# Patient Record
Sex: Male | Born: 1985 | Race: White | Hispanic: No | Marital: Single | Smoking: Current every day smoker
Health system: Southern US, Community
[De-identification: ages and names within clinical notes are randomized; demographics above are authoritative.]

---

## 2009-02-18 ENCOUNTER — Emergency Department (HOSPITAL_COMMUNITY): Admission: EM | Admit: 2009-02-18 | Discharge: 2009-02-18 | Payer: Self-pay | Admitting: Emergency Medicine

## 2011-09-21 ENCOUNTER — Emergency Department (HOSPITAL_COMMUNITY): Payer: Self-pay

## 2011-09-21 ENCOUNTER — Encounter (HOSPITAL_COMMUNITY): Payer: Self-pay | Admitting: *Deleted

## 2011-09-21 ENCOUNTER — Emergency Department (HOSPITAL_COMMUNITY)
Admission: EM | Admit: 2011-09-21 | Discharge: 2011-09-21 | Disposition: A | Discharge status: Home / Self Care | Payer: Self-pay | Attending: Emergency Medicine | Admitting: Emergency Medicine

## 2011-09-21 DIAGNOSIS — S8390XA Sprain of unspecified site of unspecified knee, initial encounter: Secondary | ICD-10-CM

## 2011-09-21 DIAGNOSIS — M25569 Pain in unspecified knee: Secondary | ICD-10-CM | POA: Insufficient documentation

## 2011-09-21 DIAGNOSIS — S61409A Unspecified open wound of unspecified hand, initial encounter: Secondary | ICD-10-CM | POA: Insufficient documentation

## 2011-09-21 DIAGNOSIS — IMO0002 Reserved for concepts with insufficient information to code with codable children: Secondary | ICD-10-CM | POA: Insufficient documentation

## 2011-09-21 DIAGNOSIS — M25469 Effusion, unspecified knee: Secondary | ICD-10-CM | POA: Insufficient documentation

## 2011-09-21 MED ORDER — OXYCODONE-ACETAMINOPHEN 5-325 MG PO TABS: 2.0000 | ORAL_TABLET | ORAL | Status: AC | PRN

## 2011-09-21 MED ORDER — ONDANSETRON 4 MG PO TBDP
4.0000 mg | ORAL_TABLET | Freq: Once | ORAL | Status: AC
  Administered 2011-09-21: 4 mg via ORAL
  Filled 2011-09-21: qty 1

## 2011-09-21 MED ORDER — MORPHINE SULFATE 4 MG/ML IJ SOLN
6.0000 mg | Freq: Once | INTRAMUSCULAR | Status: AC
  Administered 2011-09-21: 6 mg via INTRAMUSCULAR
  Filled 2011-09-21: qty 2
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
## 2011-09-21 NOTE — ED Notes (Signed)
Wound care performed.  telfa and kerlix applied

## 2012-03-31 ENCOUNTER — Emergency Department (HOSPITAL_COMMUNITY)
Admission: EM | Admit: 2012-03-31 | Discharge: 2012-03-31 | Disposition: A | Discharge status: Home / Self Care | Payer: Self-pay | Attending: Emergency Medicine | Admitting: Emergency Medicine

## 2012-03-31 ENCOUNTER — Encounter (HOSPITAL_COMMUNITY): Payer: Self-pay | Admitting: *Deleted

## 2012-03-31 DIAGNOSIS — R221 Localized swelling, mass and lump, neck: Secondary | ICD-10-CM | POA: Insufficient documentation

## 2012-03-31 DIAGNOSIS — R220 Localized swelling, mass and lump, head: Secondary | ICD-10-CM | POA: Insufficient documentation

## 2012-03-31 DIAGNOSIS — K047 Periapical abscess without sinus: Secondary | ICD-10-CM | POA: Insufficient documentation

## 2012-03-31 DIAGNOSIS — F17200 Nicotine dependence, unspecified, uncomplicated: Secondary | ICD-10-CM | POA: Insufficient documentation

## 2012-03-31 MED ORDER — OXYCODONE-ACETAMINOPHEN 5-325 MG PO TABS: 2.0000 | ORAL_TABLET | ORAL | Status: DC | PRN

## 2012-03-31 MED ORDER — OXYCODONE-ACETAMINOPHEN 5-325 MG PO TABS
2.0000 | ORAL_TABLET | Freq: Once | ORAL | Status: AC
  Administered 2012-03-31: 2 via ORAL
  Filled 2012-03-31: qty 2

## 2012-03-31 MED ORDER — CLINDAMYCIN HCL 150 MG PO CAPS: 300.0000 mg | ORAL_CAPSULE | Freq: Three times a day (TID) | ORAL | Status: DC

## 2012-03-31 NOTE — ED Notes (Signed)
Has right side toothache that started yesterday and now havign swelling. Airway intact.

## 2012-03-31 NOTE — ED Provider Notes (Signed)
History     CSN: [id number]  Arrival date & time 03/31/12  1423   First MD Initiated Contact with Patient 03/31/12 1726      Chief Complaint  Patient presents with  . Dental Pain    (Consider location/radiation/quality/duration/timing/severity/associated sxs/prior treatment) HPI Comments: The patient is a 27 year old otherwise healthy male who presents with dental pain that started gradually one day ago. The dental pain is severe, constant and progressively worsening. The pain is aching and located in right upper jaw. The pain does not radiate. Eating makes the pain worse. Nothing makes the pain better. The patient has not tried anything for pain. No associated symptoms. Patient denies headache, neck pain/stiffness, fever, NVD, edema, sore throat, throat swelling, wheezing, SOB, chest pain, abdominal pain.      History reviewed. No pertinent past medical history.  History reviewed. No pertinent past surgical history.  History reviewed. No pertinent family history.  History  Substance Use Topics  . Smoking status: Current Every Day Smoker -- 0.5 packs/day  . Smokeless tobacco: Not on file  . Alcohol Use: Yes     Comment: rarely      Review of Systems  HENT: Positive for facial swelling and dental problem.   All other systems reviewed and are negative.    Allergies  Penicillins  Home Medications  No current outpatient prescriptions on file.  BP 138/78  Pulse 79  Temp 98.7 F (37.1 C) (Oral)  Resp 16  SpO2 96%  Physical Exam  Nursing note and vitals reviewed. Constitutional: He is oriented to person, place, and time. He appears well-developed and well-nourished. No distress.  HENT:  Head: Normocephalic and atraumatic.       Poor dentition. Right upper jaw teeth tender to percussion. Right upper jaw swelling that is tender to palpation.   Eyes: Conjunctivae normal are normal.  Neck: Normal range of motion. Neck supple.  Cardiovascular: Normal rate and  regular rhythm.  Exam reveals no gallop and no friction rub.   No murmur heard. Pulmonary/Chest: Effort normal and breath sounds normal. He has no wheezes. He has no rales. He exhibits no tenderness.  Abdominal: Soft. He exhibits no distension.  Musculoskeletal: Normal range of motion.  Neurological: He is alert and oriented to person, place, and time.       Speech is goal-oriented. Moves limbs without ataxia.   Skin: Skin is warm and dry.  Psychiatric: He has a normal mood and affect. His behavior is normal.    ED Course  Procedures (including critical care time)  Labs Reviewed - No data to display No results found.   No diagnosis found.    MDM  5:29 PM Patient will have Pen VK, percocet, and recommended follow up a dentist. Patient is afebrile with stable vitals. Patient instructed to call dentist within 48 hour to be seen. No further evaluation needed at this time.        Emilia Beck, [place] 03/31/12 1926

## 2012-04-01 NOTE — ED Provider Notes (Signed)
Medical screening examination/treatment/procedure(s) were performed by non-physician practitioner and as supervising physician I was immediately available for consultation/collaboration.   Rosha Cocker M Rella Egelston, DO 04/01/12 1607 

## 2015-04-12 ENCOUNTER — Emergency Department (HOSPITAL_COMMUNITY)
Admission: EM | Admit: 2015-04-12 | Discharge: 2015-04-12 | Disposition: A | Discharge status: Home / Self Care | Payer: Self-pay | Attending: Emergency Medicine | Admitting: Emergency Medicine

## 2015-04-12 ENCOUNTER — Encounter (HOSPITAL_COMMUNITY): Payer: Self-pay | Admitting: Nurse Practitioner

## 2015-04-12 DIAGNOSIS — Z880 Allergy status to penicillin: Secondary | ICD-10-CM | POA: Insufficient documentation

## 2015-04-12 DIAGNOSIS — F17200 Nicotine dependence, unspecified, uncomplicated: Secondary | ICD-10-CM | POA: Insufficient documentation

## 2015-04-12 DIAGNOSIS — B86 Scabies: Secondary | ICD-10-CM

## 2015-04-12 DIAGNOSIS — Z792 Long term (current) use of antibiotics: Secondary | ICD-10-CM | POA: Insufficient documentation

## 2015-04-12 MED ORDER — PERMETHRIN 5 % EX CREA: 1.0000 "application " | TOPICAL_CREAM | Freq: Once | CUTANEOUS | Status: DC

## 2015-04-12 NOTE — Discharge Instructions (Signed)
Please read and follow all provided instructions.  Your diagnoses today include:  1. Scabies    Tests performed today include:  Vital signs. See below for your results today.   Medications prescribed:   Permethrin cream - Apply to entire body before bed and wash off in morning, repeat in one week if not resolved.  Home care instructions:  Follow any educational materials contained in this packet.  You can use benadryl as directed on packaging for itching. You may also use loradine (Claritin) as directed as this medication will not make you very sleepy.   Do not use hydrocortisone or any other type of steroid cream -- it will not kill the scabies parasite and will make the rash worse.   You need to disinfect surfaces, vacuum floors, and wash all clothes and bedding in hot water.   Follow-up instructions: Please follow-up with your primary care provider as needed for further evaluation of your symptoms.  Return instructions:   Please return to the Emergency Department if you experience worsening symptoms.   Please return if you have any other emergent concerns.  Additional Information:  Your vital signs today were: BP 142/83 mmHg   Pulse 89   Temp(Src) 98.6 F (37 C) (Oral)   Resp 16   SpO2 97% If your blood pressure (BP) was elevated above 135/85 this visit, please have this repeated by your doctor within one month. ---------------

## 2015-04-12 NOTE — ED Notes (Signed)
Pt verbalized understanding of d/c instructions, prescriptions, and follow-up care. No further questions/concerns, VSS, ambulatory w/ steady gait (refused wheelchair) 

## 2015-04-12 NOTE — ED Provider Notes (Signed)
CSN: [id number]     Arrival date & time 04/12/15  1234 History   First MD Initiated Contact with Patient 04/12/15 1310     Chief Complaint  Patient presents with  . Rash  . possible scabies    (Consider location/radiation/quality/duration/timing/severity/associated sxs/prior Treatment) HPI 30 y.o. male presents to the Emergency Department today complaining of scabies. Notes that he was in jail previously and diagnosed with Scabies. Treated there, but did not change blanket. Symptoms returned 1 month later. Currently itching on hands, arms, torso, legs. No N/V/D. No CP/SOB/ABD pain. No other symptoms noted.   History reviewed. No pertinent past medical history. History reviewed. No pertinent past surgical history. No family history on file. Social History  Substance Use Topics  . Smoking status: Current Every Day Smoker -- 0.50 packs/day  . Smokeless tobacco: None  . Alcohol Use: Yes     Comment: rarely    Review of Systems ROS reviewed and all are negative for acute change except as noted in the HPI.  Allergies  Amoxicillin and Penicillins  Home Medications   Prior to Admission medications   Medication Sig Start Date End Date Taking? Authorizing Provider  clindamycin (CLEOCIN) 150 MG capsule Take 2 capsules (300 mg total) by mouth 3 (three) times daily. May dispense as  capsules 03/31/12   Emilia Beck, PA-C  oxyCODONE-acetaminophen (PERCOCET/ROXICET) 5-325 MG per tablet Take 2 tablets by mouth every 4 (four) hours as needed for pain. 03/31/12   Kaitlyn Szekalski, PA-C   BP 142/83 mmHg  Pulse 89  Temp(Src) 98.6 F (37 C) (Oral)  Resp 16  SpO2 97% Physical Exam  Constitutional: He is oriented to person, place, and time. He appears well-developed and well-nourished.  HENT:  Head: Normocephalic and atraumatic.  Eyes: EOM are normal.  Cardiovascular: Normal rate and regular rhythm.   Pulmonary/Chest: Effort normal.  Abdominal: Soft.  Musculoskeletal: Normal range  of motion.  Diffuse pruritic maculopapular lesions with borrows on hands, arms, torso, legs.   Neurological: He is alert and oriented to person, place, and time.  Skin: Skin is warm and dry.  Psychiatric: He has a normal mood and affect. His behavior is normal. Thought content normal.  Nursing note and vitals reviewed.   ED Course  Procedures (including critical care time) Labs Review Labs Reviewed - No data to display  Imaging Review No results found. I have personally reviewed and evaluated these images and lab results as part of my medical decision-making.   EKG Interpretation None      MDM  I have reviewed the relevant previous healthcare records. I obtained HPI from historian.  ED Course:  Assessment: 1y M with pmh scabies presents with reoccurrence of symptoms. Diffuse pruritic maculopapular lesions with borrows noted on arms/legs/hands, especially web spaces. Will treat with Permethrin and have him follow up with PCP for further management. Patient is in no acute distress. Vital Signs are stable. Patient is able to ambulate. Patient able to tolerate PO.    Disposition/Plan:  DC Home Additional Verbal discharge instructions given and discussed with patient.  Pt Instructed to f/u with PCP in the next 1 week for evaluation and treatment of symptoms. Return precautions given Pt acknowledges and agrees with plan  Supervising Physician Loren Racer, MD   Final diagnoses:  Scabies        Audry Pili, PA-C 04/12/15 1332  Loren Racer, MD 04/12/15 1540
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
## 2015-05-24 DIAGNOSIS — Z139 Encounter for screening, unspecified: Secondary | ICD-10-CM

## 2015-05-29 NOTE — Congregational Nurse Program (Signed)
Congregational Nurse Program Note  Date of Encounter: 05/24/2015  Past Medical History: No past medical history on file.  Encounter Details:     CNP Questionnaire - 05/24/15 1438    Patient Demographics   Is this a new or existing patient? New   Patient is considered a/an Not Applicable   Race Caucasian/White   Patient Assistance   Location of Patient Assistance Not Applicable   Patient's financial/insurance status Low Income;Self-Pay   Uninsured Patient Yes   Interventions Counseled to make appt. with provider   Patient referred to apply for the following financial assistance [employer]   Food insecurities addressed Provided food supplies   Transportation assistance No   Assistance securing medications No   Product/process development scientistducational health offerings Navigating the healthcare system   Encounter Details   Primary purpose of visit Education/Health Concerns   Was an Emergency Department visit averted? Not Applicable   Does patient have a medical provider? No   Patient referred to Clinic   Was a mental health screening completed? (GAINS tool) No   Does patient have dental issues? No   Does patient have vision issues? No   Since previous encounter, have you referred patient for abnormal blood pressure that resulted in a new diagnosis or medication change? No   Since previous encounter, have you referred patient for abnormal blood glucose that resulted in a new diagnosis or medication change? No   For Abstraction Use Only   Does patient have insurance? No      B/P check

## 2015-11-08 ENCOUNTER — Emergency Department (HOSPITAL_COMMUNITY)
Admission: EM | Admit: 2015-11-08 | Discharge: 2015-11-09 | Disposition: A | Discharge status: Home / Self Care | Payer: Self-pay | Attending: Emergency Medicine | Admitting: Emergency Medicine

## 2015-11-08 ENCOUNTER — Encounter (HOSPITAL_COMMUNITY): Payer: Self-pay

## 2015-11-08 DIAGNOSIS — F17200 Nicotine dependence, unspecified, uncomplicated: Secondary | ICD-10-CM | POA: Insufficient documentation

## 2015-11-08 DIAGNOSIS — F1910 Other psychoactive substance abuse, uncomplicated: Secondary | ICD-10-CM | POA: Insufficient documentation

## 2015-11-08 LAB — I-STAT CHEM 8, ED
BUN: 11 mg/dL (ref 6–20)
CALCIUM ION: 1.06 mmol/L — AB (ref 1.15–1.40)
Chloride: 102 mmol/L (ref 101–111)
Creatinine, Ser: 1.1 mg/dL (ref 0.61–1.24)
Glucose, Bld: 168 mg/dL — ABNORMAL HIGH (ref 65–99)
HCT: 50 % (ref 39.0–52.0)
Hemoglobin: 17 g/dL (ref 13.0–17.0)
Potassium: 3.5 mmol/L (ref 3.5–5.1)
SODIUM: 142 mmol/L (ref 135–145)
TCO2: 21 mmol/L (ref 0–100)

## 2015-11-08 NOTE — ED Notes (Signed)
Per EMS- Pt OD'ed on Heroin. Was breathing 6 times per minute with pulse. Lost consciousness- lowered to ground. Diaphoretic. Given total 2mg  of narcan. Admits to using crack, heroin, alcohol, marijuana.

## 2015-11-08 NOTE — ED Provider Notes (Signed)
[facility]-EMERGENCY DEPT Provider Note   CSN: [id number] Arrival date & time: 11/08/15  2219     History   Chief Complaint Chief Complaint  Patient presents with  . Drug Overdose    HPI Ronald Mcbride is a 30 y.o. male.   Drug Overdose    Patient presents to the emergency room after an accidental drug overdose. The patient admits to using marijuana, cocaine and snorting heroin. He doesn't usually use heroin but this is not the first time. He is not sure what happened after that. The next thing he knows is being in the ambulance. According to the EMS report patient was found breathing proximate 6 times per minute. He had a pulse. He was given 2 mg of Narcan. His symptoms have improved. Patient does complain of a headache and some chronic back pain. Denies any fevers or chills. Denies any other complaints. History reviewed. No pertinent past medical history.  There are no active problems to display for this patient.   History reviewed. No pertinent surgical history.     Home Medications    Prior to Admission medications   Not on File    Family History No family history on file.  Social History Social History  Substance Use Topics  . Smoking status: Current Every Day Smoker    Packs/day: 0.50  . Smokeless tobacco: Not on file  . Alcohol use Yes     Comment: rarely     Allergies   Amoxicillin and Penicillins   Review of Systems Review of Systems  All other systems reviewed and are negative.    Physical Exam Updated Vital Signs BP 122/77   Pulse 80   Temp 97.5 F (36.4 C)   Resp 11   SpO2 96%   Physical Exam  Constitutional: He appears well-developed and well-nourished. He appears listless. No distress.  HENT:  Head: Normocephalic and atraumatic.  Right Ear: External ear normal.  Left Ear: External ear normal.  Eyes: Conjunctivae are normal. Right eye exhibits no discharge. Left eye exhibits no discharge. No scleral icterus.  Neck: Neck supple. No  tracheal deviation present.  Cardiovascular: Normal rate, regular rhythm and intact distal pulses.   Pulmonary/Chest: Effort normal and breath sounds normal. No stridor. No respiratory distress. He has no wheezes. He has no rales.  Abdominal: Soft. Bowel sounds are normal. He exhibits no distension. There is no tenderness. There is no rebound and no guarding.  Musculoskeletal: He exhibits no edema or tenderness.  Neurological: He has normal strength. He appears listless. No cranial nerve deficit (no facial droop, extraocular movements intact, no slurred speech) or sensory deficit. He exhibits normal muscle tone. He displays no seizure activity. Coordination normal.  Skin: Skin is warm and dry. No rash noted.  Psychiatric: He has a normal mood and affect.  Nursing note and vitals reviewed.    ED Treatments / Results  Labs (all labs ordered are listed, but only abnormal results are displayed) Labs Reviewed  I-STAT CHEM 8, ED - Abnormal; Notable for the following:       Result Value   Glucose, Bld 168 (*)    Calcium, Ion 1.06 (*)    All other components within normal limits    Procedures Procedures (including critical care time)  Medications Ordered in ED Medications  acetaminophen (TYLENOL) tablet 650 mg (not administered)     Initial Impression / Assessment and Plan / ED Course  I have reviewed the triage vital signs and the nursing notes.  Pertinent  labs & imaging results that were available during my care of the patient were reviewed by me and considered in my medical decision making (see chart for details).  Clinical Course  Comment By Time  Patient was monitored in the emergency room. He remained stable. Patient requested a Tylenol for his lower back before he went home. I will provide him with an outpatient resource guide for substance abuse treatment. Linwood DibblesJon Dewane Timson, MD 09/01 [phone]     Final Clinical Impressions(s) / ED Diagnoses   Final diagnoses:  Substance abuse     New Prescriptions Current Discharge Medication List       Linwood DibblesJon Alban Marucci, MD 11/09/15 [phone]

## 2015-11-09 MED ORDER — ACETAMINOPHEN 325 MG PO TABS
650.0000 mg | ORAL_TABLET | Freq: Once | ORAL | Status: AC
  Administered 2015-11-09: 650 mg via ORAL
  Filled 2015-11-09: qty 2

## 2017-07-29 ENCOUNTER — Other Ambulatory Visit: Payer: Self-pay

## 2017-07-29 ENCOUNTER — Encounter (HOSPITAL_COMMUNITY): Payer: Self-pay | Admitting: *Deleted

## 2017-07-29 ENCOUNTER — Emergency Department (HOSPITAL_COMMUNITY)
Admission: EM | Admit: 2017-07-29 | Discharge: 2017-07-29 | Disposition: A | Discharge status: Home / Self Care | Payer: Self-pay | Attending: Emergency Medicine | Admitting: Emergency Medicine

## 2017-07-29 DIAGNOSIS — Y939 Activity, unspecified: Secondary | ICD-10-CM | POA: Insufficient documentation

## 2017-07-29 DIAGNOSIS — F17210 Nicotine dependence, cigarettes, uncomplicated: Secondary | ICD-10-CM | POA: Insufficient documentation

## 2017-07-29 DIAGNOSIS — Y990 Civilian activity done for income or pay: Secondary | ICD-10-CM | POA: Insufficient documentation

## 2017-07-29 DIAGNOSIS — S61511A Laceration without foreign body of right wrist, initial encounter: Secondary | ICD-10-CM | POA: Insufficient documentation

## 2017-07-29 DIAGNOSIS — W268XXA Contact with other sharp object(s), not elsewhere classified, initial encounter: Secondary | ICD-10-CM | POA: Insufficient documentation

## 2017-07-29 DIAGNOSIS — Y929 Unspecified place or not applicable: Secondary | ICD-10-CM | POA: Insufficient documentation

## 2017-07-29 MED ORDER — BACITRACIN-NEOMYCIN-POLYMYXIN 400-5-5000 EX OINT
TOPICAL_OINTMENT | Freq: Once | CUTANEOUS | Status: AC
  Administered 2017-07-29: 10:00:00 via TOPICAL
  Filled 2017-07-29: qty 1

## 2017-07-29 MED ORDER — LIDOCAINE-EPINEPHRINE (PF) 2 %-1:200000 IJ SOLN
20.0000 mL | Freq: Once | INTRAMUSCULAR | Status: AC
  Administered 2017-07-29: 20 mL
  Filled 2017-07-29: qty 20
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
## 2017-07-29 NOTE — ED Provider Notes (Signed)
Patient under care of Dr. Jeraldine Loots. See his note for full H&P. Briefly pt here with laceration to right distal forearm.  Bleeding controlled PTA. He is ambidextrous.  Denies numbness. Tetanus within 1 year.  Physical Exam  BP 135/83 (BP Location: Left Arm)   Pulse 85   Temp 98.7 F (37.1 C) (Oral)   Resp 16   SpO2 94%   Physical Exam  Constitutional: No distress.  HENT:  Head: Atraumatic.  Eyes: Conjunctivae are normal.  Cardiovascular: Normal rate.  2+ radial and ulnar pulses bilaterally. Brisk cap refill to fingers. Warm fingers.   Pulmonary/Chest: Effort normal.  Musculoskeletal:  Tendon sheath and vein visualized through laceration. Full AROM of RUE.   Neurological: He is alert.  Sensation to light touch intact in right hand, pt reports tingling to middle finger. 5/5 strength in right hand.   Skin:  Approx 8 cm laceration to radial aspect of distal right arm.   Psychiatric: He has a normal mood and affect.    ED Course/Procedures     .Marland KitchenLaceration Repair Date/Time: 07/29/2017 10:00 AM Performed by: Liberty Handy, PA-C Authorized by: Liberty Handy, PA-C   Consent:    Consent obtained:  Verbal   Consent given by:  Patient   Risks discussed:  Infection and pain   Alternatives discussed:  No treatment and referral Anesthesia (see MAR for exact dosages):    Anesthesia method:  Local infiltration   Local anesthetic:  Lidocaine 2% WITH epi Laceration details:    Location:  Shoulder/arm   Shoulder/arm location:  R lower arm   Length (cm):  8 Repair type:    Repair type:  Intermediate Pre-procedure details:    Preparation:  Patient was prepped and draped in usual sterile fashion Exploration:    Hemostasis achieved with:  Epinephrine and direct pressure   Wound exploration: wound explored through full range of motion and entire depth of wound probed and visualized     Wound extent: vascular damage (vein, hemostatic)     Wound extent: no foreign bodies/material  noted, no nerve damage noted and no tendon damage noted   Treatment:    Area cleansed with:  Betadine   Amount of cleaning:  Extensive   Irrigation solution:  Tap water (and cleansing solution)   Irrigation volume:  200   Irrigation method:  Pressure wash and syringe   Visualized foreign bodies/material removed: no   Skin repair:    Repair method:  Sutures   Suture size:  5-0   Suture material:  Prolene   Suture technique:  Simple interrupted   Number of sutures:  8 Approximation:    Approximation:  Close Post-procedure details:    Dressing:  Antibiotic ointment, non-adherent dressing and splint for protection   Patient tolerance of procedure:  Tolerated well, no immediate complications    MDM   Extremity NVI pre and post procedure. Splint for protection. Rt in 8-10 days for suture removal. Wound care instructions given.        Liberty Handy, PA-C 07/29/17 1002    Gerhard Munch, MD 07/29/17 [phone]

## 2017-07-29 NOTE — ED Provider Notes (Signed)
[facility] EMERGENCY DEPARTMENT Provider Note   CSN: [id number] Arrival date & time: 07/29/17  8295     History   Chief Complaint Chief Complaint  Patient presents with  . Laceration    HPI Ronald Mcbride is a 32 y.o. male.  HPI Previously healthy male presents with concern of a wound in his right wrist. 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  He had substantial bleeding on the scene, controlled with pressure application. No loss of function of the fingers, wrist itself, no other injuries.   Tetanus is up-to-date.   History reviewed. No pertinent past medical history.  There are no active problems to display for this patient.   History reviewed. No pertinent surgical history.      Home Medications    Prior to Admission medications   Not on File    Family History No family history on file.  Social History Social History   Tobacco Use  . Smoking status: Current Every Day Smoker    Packs/day: 0.50  . Smokeless tobacco: Never Used  Substance Use Topics  . Alcohol use: Yes    Comment: rarely  . Drug use: Yes    Types: Marijuana     Allergies   Amoxicillin and Penicillins   Review of Systems Review of Systems  Constitutional: Negative for fever.  Respiratory: Negative for shortness of breath.   Cardiovascular: Negative for chest pain.  Musculoskeletal:       Negative aside from HPI  Skin:       Negative aside from HPI  Allergic/Immunologic: Negative for immunocompromised state.  Neurological: Negative for weakness.     Physical Exam Updated Vital Signs BP 135/83 (BP Location: Left Arm)   Pulse 85   Temp 98.7 F (37.1 C) (Oral)   Resp 16   SpO2 94%   Physical Exam  Constitutional: He is oriented to person, place, and time. He appears well-developed. No distress.  HENT:  Head: Normocephalic and atraumatic.    Eyes: Conjunctivae and EOM are normal.  Cardiovascular: Normal rate, regular rhythm and intact distal pulses.  Pulmonary/Chest: Effort normal. No stridor. No respiratory distress.  Abdominal: He exhibits no distension.  Musculoskeletal: He exhibits no edema.       Arms: Neurological: He is alert and oriented to person, place, and time.  Skin: Skin is warm and dry.  Psychiatric: He has a normal mood and affect.  Nursing note and vitals reviewed.    ED Treatments / Results   Procedures Procedures (including critical care time)  Medications Ordered in ED Medications  lidocaine-EPINEPHrine (XYLOCAINE W/EPI) 2 %-1:200000 (PF) injection 20 mL (has no administration in time range)  neomycin-bacitracin-polymyxin (NEOSPORIN) ointment (has no administration in time range)     Initial Impression / Assessment and Plan / ED Course  I have reviewed the triage vital signs and the nursing notes.  Pertinent labs & imaging results that were available during my care of the patient were reviewed by me and considered in my medical decision making (see chart for details).  Patient had a successful wound repair completed by the physician assistant, Margarette Asal. Please see her documentation for details of laceration repair itself. Absent evidence for arterial damage, with no change in his distal neurovascular status, patient is appropriate for discharge.  Patient did have splint applied, for additional security of his laceration and suture repair.   Final  Clinical Impressions(s) / ED Diagnoses   Final diagnoses:  Laceration of right wrist, initial encounter    ED Discharge Orders    None       Gerhard Munch, MD 07/29/17 [phone]

## 2020-01-27 ENCOUNTER — Emergency Department (HOSPITAL_COMMUNITY): Payer: Self-pay

## 2020-01-27 ENCOUNTER — Encounter (HOSPITAL_COMMUNITY): Payer: Self-pay | Admitting: Emergency Medicine

## 2020-01-27 ENCOUNTER — Emergency Department (HOSPITAL_COMMUNITY)
Admission: EM | Admit: 2020-01-27 | Discharge: 2020-01-27 | Disposition: A | Discharge status: Home / Self Care | Payer: Self-pay | Attending: Emergency Medicine | Admitting: Emergency Medicine

## 2020-01-27 DIAGNOSIS — S8391XA Sprain of unspecified site of right knee, initial encounter: Secondary | ICD-10-CM | POA: Insufficient documentation

## 2020-01-27 DIAGNOSIS — Y9301 Activity, walking, marching and hiking: Secondary | ICD-10-CM | POA: Insufficient documentation

## 2020-01-27 DIAGNOSIS — F17200 Nicotine dependence, unspecified, uncomplicated: Secondary | ICD-10-CM | POA: Insufficient documentation

## 2020-01-27 DIAGNOSIS — X501XXA Overexertion from prolonged static or awkward postures, initial encounter: Secondary | ICD-10-CM | POA: Insufficient documentation

## 2020-01-27 NOTE — ED Triage Notes (Signed)
Pt reports he stepped off a ladder wrong a few days ago, c/o R knee pain. Hx of torn ligaments in R knee a few years back.

## 2020-01-27 NOTE — Progress Notes (Signed)
Orthopedic Tech Progress Note Patient Details:  Ronald Mcbride 1985-04-29 [id number]  Ortho Devices Type of Ortho Device: Knee Immobilizer, Crutches Ortho Device/Splint Location: RLE Ortho Device/Splint Interventions: Ordered, Application, Adjustment   Post Interventions Patient Tolerated: Well, Ambulated well Instructions Provided: Care of device, Poper ambulation with device   Donald Pore 01/27/2020, 12:22 PM

## 2020-01-27 NOTE — Discharge Instructions (Signed)
Please return for any problem.   Use Ibuprofen as instructed - 600mg  of motrin every 8 hours.

## 2020-01-27 NOTE — ED Provider Notes (Signed)
[facility] EMERGENCY DEPARTMENT Provider Note   CSN: [id number] Arrival date & time: 01/27/20  4259     History Chief Complaint  Patient presents with  . Knee Pain    Ronald Mcbride is a 34 y.o. male.  34 year old male with prior medical history as detailed below presents for evaluation of right knee pain.  Patient reports that he works in [employer].  He was stepping off a ladder yesterday when his knee was "tweaked".  Patient reports diffuse anterior knee pain ever since this incident.  He denies actual falling.  He denies numbness or weakness to the right leg.  He denies fever.  He reports a distant prior history of torn ligaments in the right knee.  The history is provided by the patient and medical records.  Knee Pain Location:  Knee Time since incident:  1 day Injury: yes   Mechanism of injury comment:  Misstep Knee location:  R knee Pain details:    Quality:  Aching   Radiates to:  Does not radiate   Severity:  Mild   Onset quality:  Sudden   Duration:  1 day   Timing:  Constant   Progression:  Waxing and waning Chronicity:  New Dislocation: no   Foreign body present:  No foreign bodies Prior injury to area:  Yes Relieved by:  Nothing      History reviewed. No pertinent past medical history.  There are no problems to display for this patient.   History reviewed. No pertinent surgical history.     No family history on file.  Social History   Tobacco Use  . Smoking status: Current Every Day Smoker    Packs/day: 0.50  . Smokeless tobacco: Never Used  Substance Use Topics  . Alcohol use: Yes    Comment: rarely  . Drug use: Yes    Types: Marijuana    Home Medications Prior to Admission medications   Not on File    Allergies    Amoxicillin and Penicillins  Review of Systems   Review of Systems  All other systems reviewed and are negative.   Physical Exam Updated Vital Signs BP 139/82   Pulse 88   Temp 98.1 F  (36.7 C) (Oral)   Resp 14   Ht 5\' 9"  (1.753 m)   Wt 83.9 kg   SpO2 98%   BMI 27.32 kg/m   Physical Exam Vitals and nursing note reviewed.  Constitutional:      General: He is not in acute distress.    Appearance: He is well-developed.  HENT:     Head: Normocephalic and atraumatic.  Eyes:     Conjunctiva/sclera: Conjunctivae normal.     Pupils: Pupils are equal, round, and reactive to light.  Cardiovascular:     Rate and Rhythm: Normal rate and regular rhythm.     Heart sounds: Normal heart sounds.  Pulmonary:     Effort: Pulmonary effort is normal. No respiratory distress.     Breath sounds: Normal breath sounds.  Abdominal:     General: There is no distension.     Palpations: Abdomen is soft.     Tenderness: There is no abdominal tenderness.  Musculoskeletal:        General: Tenderness and deformity present. Normal range of motion.     Cervical back: Normal range of motion and neck supple.     Comments: Diffuse anterior tenderness to the right knee.  Mild effusion noted.  No erythema.  Full active  range of motion.  Normal gait.  Distal right lower extremity is neurovascular intact.  Skin:    General: Skin is warm and dry.  Neurological:     Mental Status: He is alert and oriented to person, place, and time.     ED Results / Procedures / Treatments   Labs (all labs ordered are listed, but only abnormal results are displayed) Labs Reviewed - No data to display  EKG None  Radiology DG Knee Complete 4 Views Right  Result Date: 01/27/2020 CLINICAL DATA:  Pain post fall. EXAM: RIGHT KNEE - COMPLETE 4+ VIEW COMPARISON:  09/21/2011. FINDINGS: Moderate knee joint effusion. No evidence of fracture dislocation. No acute bony abnormality identified. IMPRESSION: Moderate knee joint effusion. No acute bony abnormality. Electronically Signed   By: Maisie Fus  Register   On: 01/27/2020 07:51    Procedures Procedures (including critical care time)  Medications Ordered in  ED Medications - No data to display  ED Course  I have reviewed the triage vital signs and the nursing notes.  Pertinent labs & imaging results that were available during my care of the patient were reviewed by me and considered in my medical decision making (see chart for details).    MDM Rules/Calculators/A&P                          MDM  Screen complete  Ronald Mcbride was evaluated in Emergency Department on 01/27/2020 for the symptoms described in the history of present illness. He was evaluated in the context of the global COVID-19 pandemic, which necessitated consideration that the patient might be at risk for infection with the SARS-CoV-2 virus that causes COVID-19. Institutional protocols and algorithms that pertain to the evaluation of patients at risk for COVID-19 are in a state of rapid change based on information released by regulatory bodies including the CDC and federal and state organizations. These policies and algorithms were followed during the patient's care in the ED.  Patient presents for evaluation of right knee pain.  Patient's presentation is consistent with likely right knee strain.  Imaging does not show evidence of fracture.  Provided with Ace wrap, immobilizer, and crutches.    Patient understands need for close follow-up.  Strict return precautions given and understood.   Final Clinical Impression(s) / ED Diagnoses Final diagnoses:  Sprain of right knee, unspecified ligament, initial encounter    Rx / DC Orders ED Discharge Orders    None       Wynetta Fines, MD 01/27/20 [phone]

## 2020-01-27 NOTE — ED Notes (Signed)
Ortho en route.  

## 2022-06-16 IMAGING — DX DG KNEE COMPLETE 4+V*R*
4 series · 4 of 4 positions shown · non-contrast
Comparison: 09/21/2011.

CLINICAL DATA: Pain post fall.

EXAM:
RIGHT KNEE - COMPLETE 4+ VIEW

[knee ap]
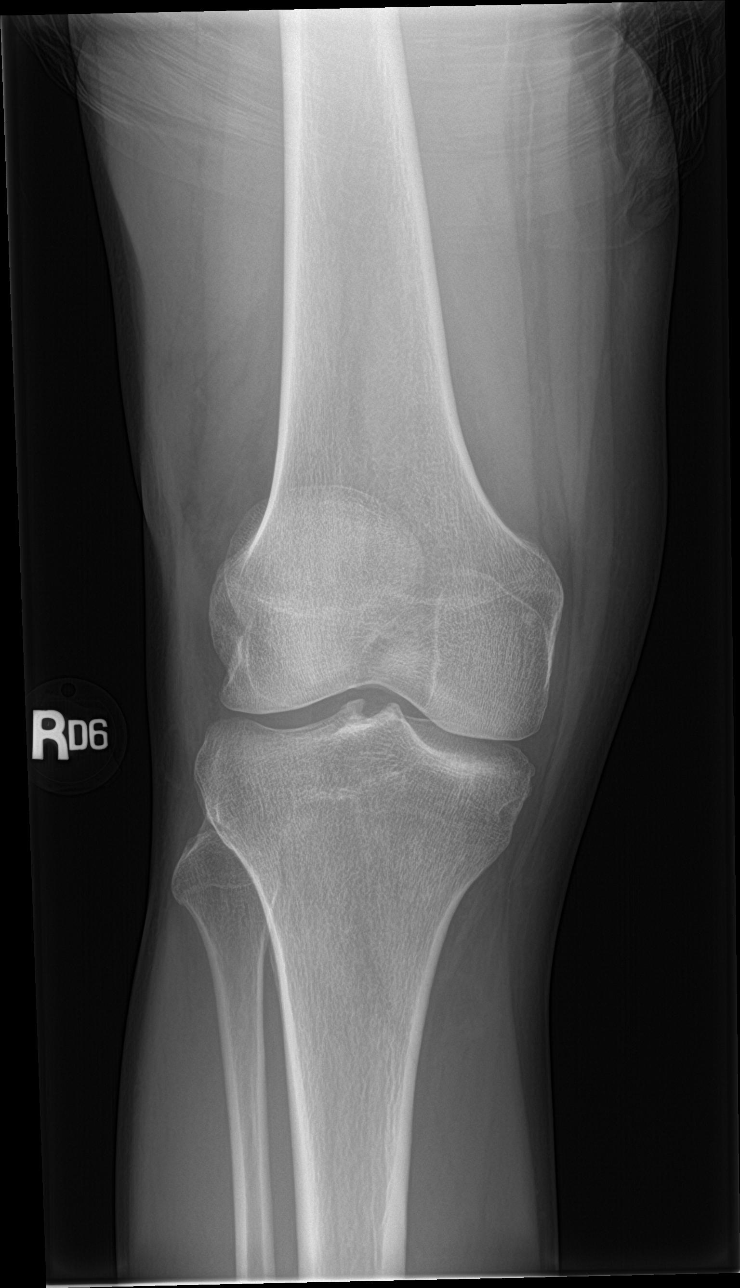

[knee lat]
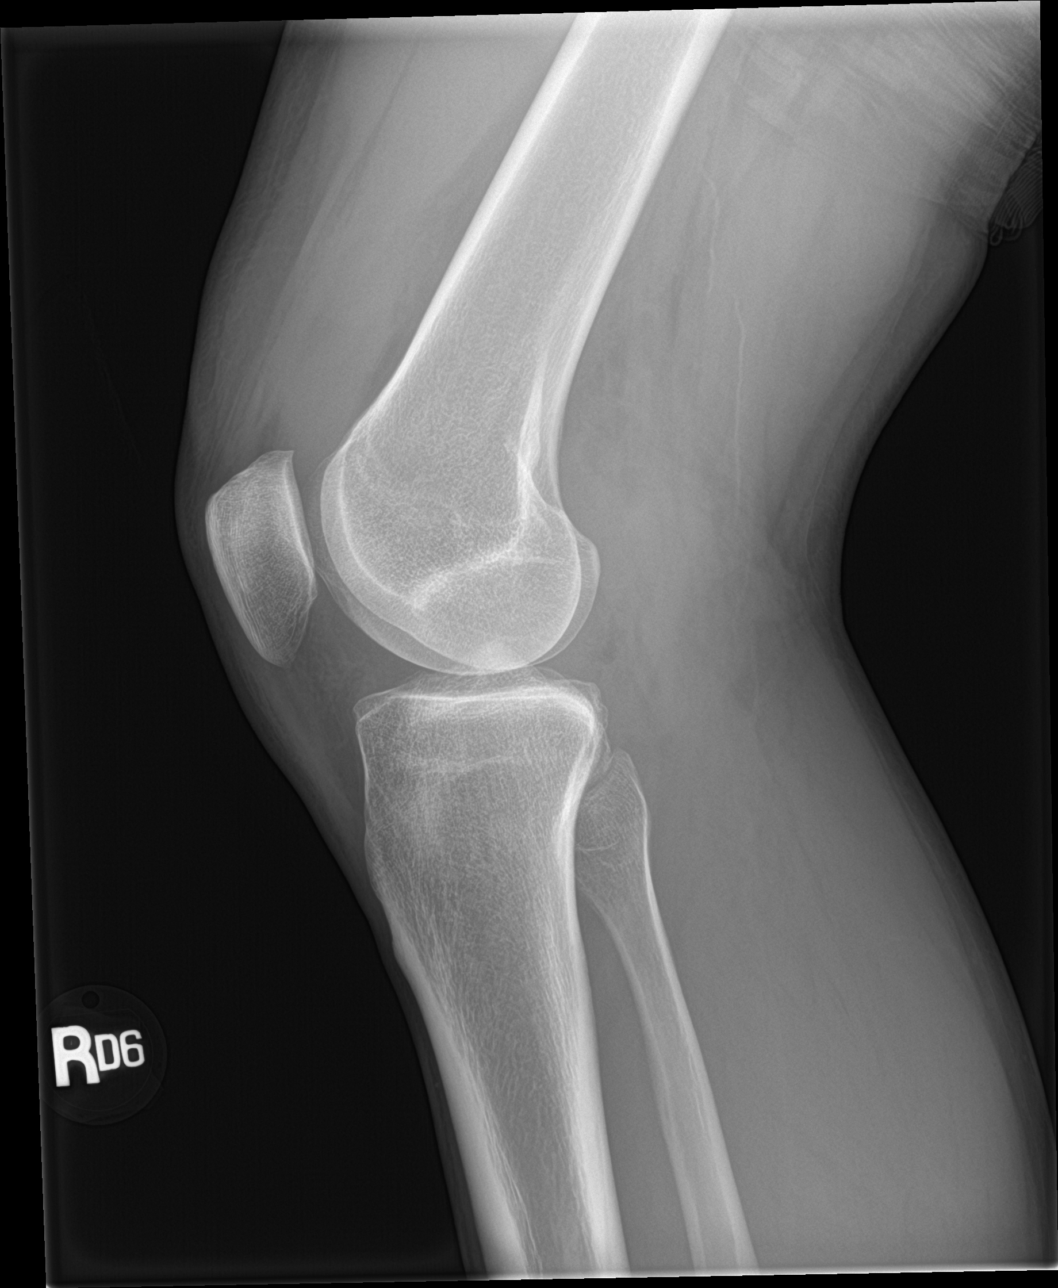

[knee obl (1 of 2)]
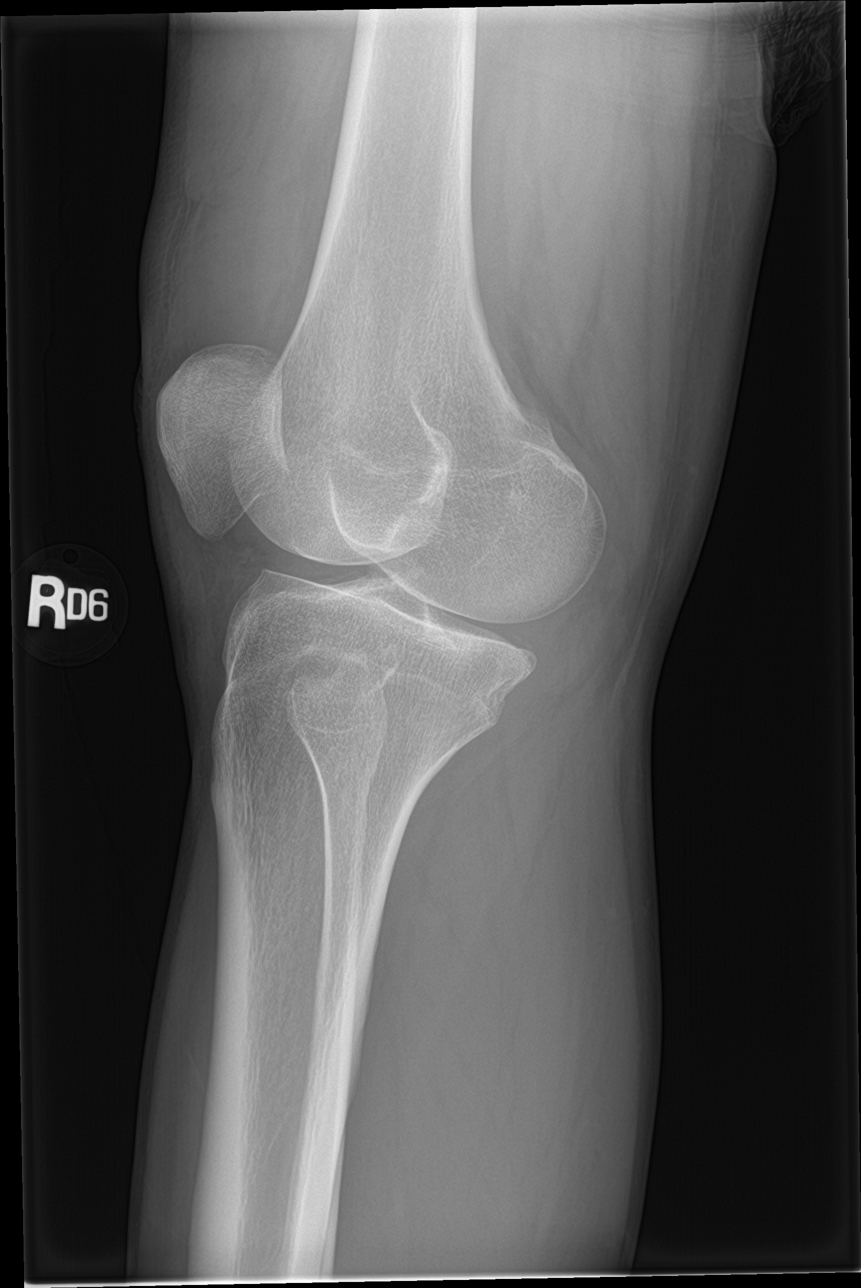

[knee obl (2 of 2)]
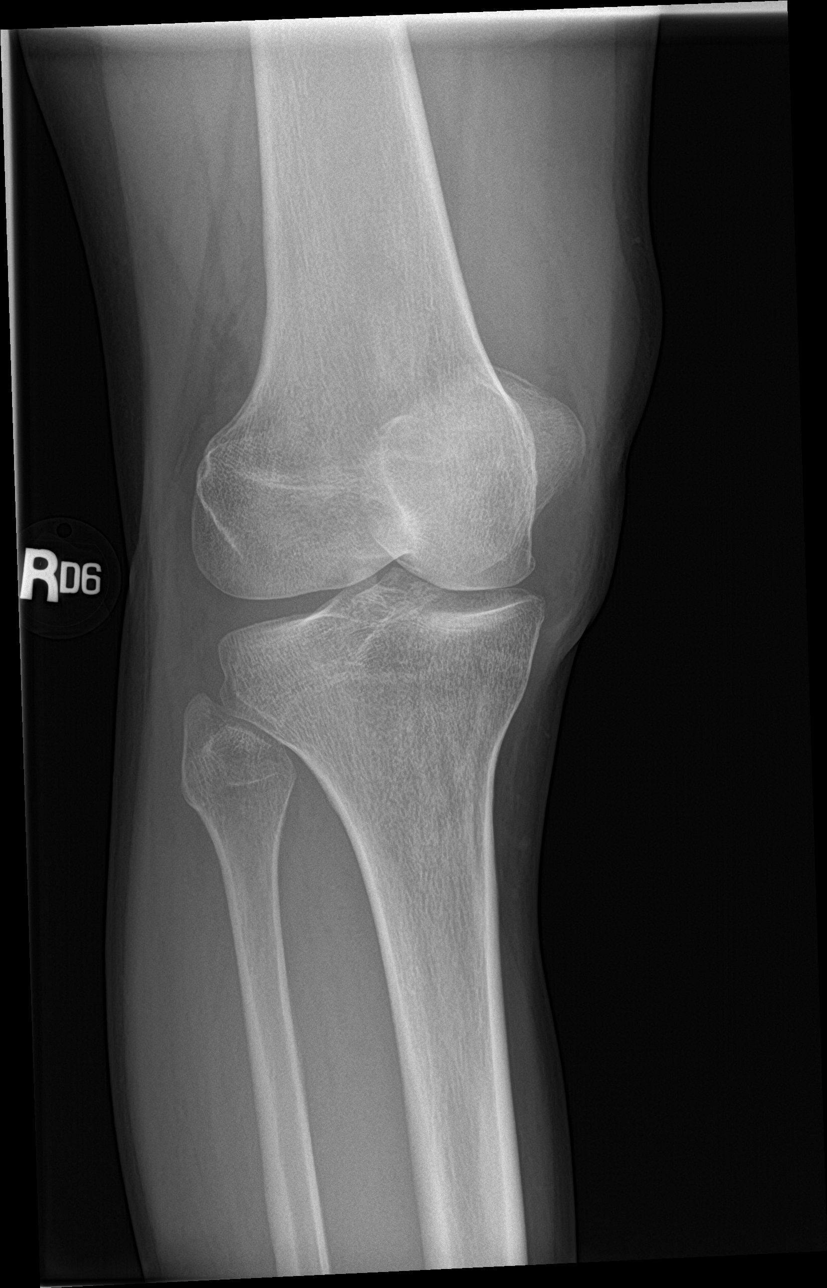

[4 of 4 positions shown; findings below may reference images not displayed]

FINDINGS: Moderate knee joint effusion. No evidence of fracture dislocation.
No acute bony abnormality identified.
IMPRESSION: Moderate knee joint effusion. No acute bony abnormality.
